# Patient Record
Sex: Female | Born: 1935 | Race: White | Hispanic: No | Smoking: Never smoker
Health system: Southern US, Community
[De-identification: ages and names within clinical notes are randomized; demographics above are authoritative.]

## PROBLEM LIST (undated history)

## (undated) DIAGNOSIS — C801 Malignant (primary) neoplasm, unspecified: Secondary | ICD-10-CM

## (undated) DIAGNOSIS — I10 Essential (primary) hypertension: Secondary | ICD-10-CM

## (undated) DIAGNOSIS — L0390 Cellulitis, unspecified: Secondary | ICD-10-CM

## (undated) HISTORY — PX: BREAST EXCISIONAL BIOPSY: SUR124

## (undated) HISTORY — PX: BREAST CYST ASPIRATION: SHX578

---

## 2004-06-24 ENCOUNTER — Ambulatory Visit: Payer: Self-pay | Admitting: Internal Medicine

## 2005-02-19 ENCOUNTER — Ambulatory Visit: Payer: Self-pay | Admitting: Gastroenterology

## 2005-03-11 ENCOUNTER — Ambulatory Visit: Payer: Self-pay | Admitting: Internal Medicine

## 2005-09-18 ENCOUNTER — Ambulatory Visit: Payer: Self-pay | Admitting: Internal Medicine

## 2006-02-03 ENCOUNTER — Ambulatory Visit: Payer: Self-pay | Admitting: Gerontology

## 2006-02-08 ENCOUNTER — Ambulatory Visit: Payer: Self-pay

## 2006-11-03 ENCOUNTER — Ambulatory Visit: Payer: Self-pay | Admitting: Internal Medicine

## 2007-04-26 ENCOUNTER — Ambulatory Visit: Payer: Self-pay | Admitting: Internal Medicine

## 2007-11-07 ENCOUNTER — Ambulatory Visit: Payer: Self-pay | Admitting: Internal Medicine

## 2009-03-05 ENCOUNTER — Ambulatory Visit: Payer: Self-pay | Admitting: Internal Medicine

## 2010-06-05 ENCOUNTER — Ambulatory Visit: Payer: Self-pay | Admitting: Podiatry

## 2010-09-23 ENCOUNTER — Ambulatory Visit: Payer: Self-pay | Admitting: Internal Medicine

## 2011-06-13 ENCOUNTER — Emergency Department: Payer: Self-pay | Admitting: Emergency Medicine

## 2011-06-13 LAB — URINALYSIS, COMPLETE
Bacteria: NONE SEEN
Bilirubin,UR: NEGATIVE
Blood: NEGATIVE
Glucose,UR: NEGATIVE mg/dL (ref 0–75)
Leukocyte Esterase: NEGATIVE
Nitrite: NEGATIVE
Ph: 7 (ref 4.5–8.0)
Protein: NEGATIVE
RBC,UR: 2 /HPF (ref 0–5)
Specific Gravity: 1.011 (ref 1.003–1.030)
Squamous Epithelial: <1
WBC UR: <1 /HPF (ref 0–5)

## 2011-06-13 LAB — CBC
HCT: 37.7 % (ref 35.0–47.0)
HGB: 12.7 g/dL (ref 12.0–16.0)
MCH: 30.4 pg (ref 26.0–34.0)
MCHC: 33.6 g/dL (ref 32.0–36.0)
MCV: 91 fL (ref 80–100)
Platelet: 208 10*3/uL (ref 150–440)
RBC: 4.17 10*6/uL (ref 3.80–5.20)
RDW: 13.6 % (ref 11.5–14.5)
WBC: 14 10*3/uL — ABNORMAL HIGH (ref 3.6–11.0)

## 2011-06-13 LAB — BASIC METABOLIC PANEL
Anion Gap: 11 (ref 7–16)
BUN: 17 mg/dL (ref 7–18)
Calcium, Total: 8.5 mg/dL (ref 8.5–10.1)
Chloride: 99 mmol/L (ref 98–107)
Co2: 27 mmol/L (ref 21–32)
Creatinine: 0.58 mg/dL — ABNORMAL LOW (ref 0.60–1.30)
EGFR (African American): >60
EGFR (Non-African Amer.): >60
Glucose: 114 mg/dL — ABNORMAL HIGH (ref 65–99)
Osmolality: 276 (ref 275–301)
Potassium: 3.4 mmol/L — ABNORMAL LOW (ref 3.5–5.1)
Sodium: 137 mmol/L (ref 136–145)

## 2011-10-21 ENCOUNTER — Ambulatory Visit: Payer: Self-pay | Admitting: Internal Medicine

## 2012-04-01 ENCOUNTER — Ambulatory Visit: Payer: Self-pay | Admitting: Internal Medicine

## 2012-05-27 ENCOUNTER — Ambulatory Visit: Payer: Self-pay | Admitting: Gastroenterology

## 2012-06-06 ENCOUNTER — Ambulatory Visit: Payer: Self-pay | Admitting: Gastroenterology

## 2012-06-08 ENCOUNTER — Ambulatory Visit: Payer: Self-pay | Admitting: Gastroenterology

## 2012-06-09 LAB — PATHOLOGY REPORT

## 2012-11-02 ENCOUNTER — Ambulatory Visit: Payer: Self-pay | Admitting: Internal Medicine

## 2012-11-29 ENCOUNTER — Ambulatory Visit: Payer: Self-pay | Admitting: Gastroenterology

## 2013-06-02 ENCOUNTER — Ambulatory Visit: Payer: Self-pay | Admitting: Gastroenterology

## 2014-01-22 ENCOUNTER — Ambulatory Visit: Payer: Self-pay | Admitting: Internal Medicine

## 2016-11-03 ENCOUNTER — Other Ambulatory Visit: Payer: Self-pay | Admitting: Internal Medicine

## 2016-11-03 DIAGNOSIS — Z1231 Encounter for screening mammogram for malignant neoplasm of breast: Secondary | ICD-10-CM

## 2016-11-18 ENCOUNTER — Ambulatory Visit
Admission: RE | Admit: 2016-11-18 | Discharge: 2016-11-18 | Disposition: A | Discharge status: Home / Self Care | Payer: Medicare Other | Source: Ambulatory Visit | Attending: Internal Medicine | Admitting: Internal Medicine

## 2016-11-18 DIAGNOSIS — Z1231 Encounter for screening mammogram for malignant neoplasm of breast: Secondary | ICD-10-CM | POA: Insufficient documentation

## 2016-11-23 ENCOUNTER — Other Ambulatory Visit: Payer: Self-pay | Admitting: Internal Medicine

## 2016-11-23 DIAGNOSIS — N6320 Unspecified lump in the left breast, unspecified quadrant: Secondary | ICD-10-CM

## 2016-11-23 DIAGNOSIS — R928 Other abnormal and inconclusive findings on diagnostic imaging of breast: Secondary | ICD-10-CM

## 2016-11-30 ENCOUNTER — Ambulatory Visit
Admission: RE | Admit: 2016-11-30 | Discharge: 2016-11-30 | Disposition: A | Discharge status: Home / Self Care | Payer: Medicare Other | Source: Ambulatory Visit | Attending: Internal Medicine | Admitting: Internal Medicine

## 2016-11-30 DIAGNOSIS — R928 Other abnormal and inconclusive findings on diagnostic imaging of breast: Secondary | ICD-10-CM

## 2016-11-30 DIAGNOSIS — N6320 Unspecified lump in the left breast, unspecified quadrant: Secondary | ICD-10-CM

## 2016-11-30 DIAGNOSIS — N6002 Solitary cyst of left breast: Secondary | ICD-10-CM | POA: Insufficient documentation

## 2016-12-04 ENCOUNTER — Encounter: Payer: Self-pay | Admitting: Emergency Medicine

## 2016-12-04 ENCOUNTER — Emergency Department: Payer: Medicare Other

## 2016-12-04 ENCOUNTER — Emergency Department
Admission: EM | Admit: 2016-12-04 | Discharge: 2016-12-04 | Disposition: A | Discharge status: Home / Self Care | Payer: Medicare Other | Attending: Emergency Medicine | Admitting: Emergency Medicine

## 2016-12-04 DIAGNOSIS — Z7982 Long term (current) use of aspirin: Secondary | ICD-10-CM | POA: Insufficient documentation

## 2016-12-04 DIAGNOSIS — I10 Essential (primary) hypertension: Secondary | ICD-10-CM | POA: Diagnosis not present

## 2016-12-04 DIAGNOSIS — Z79899 Other long term (current) drug therapy: Secondary | ICD-10-CM | POA: Insufficient documentation

## 2016-12-04 DIAGNOSIS — W010XXA Fall on same level from slipping, tripping and stumbling without subsequent striking against object, initial encounter: Secondary | ICD-10-CM | POA: Insufficient documentation

## 2016-12-04 DIAGNOSIS — W19XXXA Unspecified fall, initial encounter: Secondary | ICD-10-CM

## 2016-12-04 DIAGNOSIS — S0181XA Laceration without foreign body of other part of head, initial encounter: Secondary | ICD-10-CM | POA: Diagnosis not present

## 2016-12-04 DIAGNOSIS — Y92014 Private driveway to single-family (private) house as the place of occurrence of the external cause: Secondary | ICD-10-CM | POA: Insufficient documentation

## 2016-12-04 DIAGNOSIS — S022XXA Fracture of nasal bones, initial encounter for closed fracture: Secondary | ICD-10-CM | POA: Insufficient documentation

## 2016-12-04 DIAGNOSIS — Y998 Other external cause status: Secondary | ICD-10-CM | POA: Insufficient documentation

## 2016-12-04 DIAGNOSIS — Y9301 Activity, walking, marching and hiking: Secondary | ICD-10-CM | POA: Diagnosis not present

## 2016-12-04 DIAGNOSIS — S0993XA Unspecified injury of face, initial encounter: Secondary | ICD-10-CM | POA: Diagnosis present

## 2016-12-04 HISTORY — DX: Malignant (primary) neoplasm, unspecified: C80.1

## 2016-12-04 HISTORY — DX: Cellulitis, unspecified: L03.90

## 2016-12-04 HISTORY — DX: Essential (primary) hypertension: I10

## 2016-12-04 MED ORDER — ACETAMINOPHEN 325 MG PO TABS
650.0000 mg | ORAL_TABLET | Freq: Once | ORAL | Status: AC
  Administered 2016-12-04: 650 mg via ORAL
  Filled 2016-12-04: qty 2

## 2016-12-04 MED ORDER — LIDOCAINE HCL (PF) 1 % IJ SOLN
5.0000 mL | Freq: Once | INTRAMUSCULAR | Status: AC
  Administered 2016-12-04: 5 mL
  Filled 2016-12-04: qty 5

## 2016-12-04 MED ORDER — LIDOCAINE-EPINEPHRINE-TETRACAINE (LET) SOLUTION
3.0000 mL | Freq: Once | NASAL | Status: AC
  Administered 2016-12-04: 3 mL via TOPICAL
  Filled 2016-12-04: qty 3

## 2016-12-04 NOTE — ED Notes (Signed)
See triage note  States she tripped   Hit her forehead  Laceration noted to left eye and side of nose  No loc

## 2016-12-04 NOTE — Discharge Instructions (Addendum)
Follow-up with Dr. Richardson Landry as scheduled. Keep the wounds clean and covered. Use antibiotic ointment to the areas. Take Tylenol and ibuprofen as needed.

## 2016-12-04 NOTE — ED Triage Notes (Signed)
Fall.  Patient states she stumbled and hit forehead on railroad tie.  Denies LOC  Lac to left eyebrow and left side of nose.  Bleeding controlled.

## 2016-12-04 NOTE — ED Provider Notes (Signed)
[facility] Emergency Department Provider Note ____________________________________________  Time seen: 1459  I have reviewed the triage vital signs and the nursing notes.  HISTORY  Chief Complaint  Fall and Laceration  HPI Susan Escobar is a 83 y.o. female presents to the ED, accompanied by her adult daughter, for evaluation of injury sustained following a mechanical fall at home. The patient does work at the [employer], when the patient stumbled as she was walking up the heel towards the house. She fell, hitting her face and forehead on [place]. Patient denies any loss of consciousness, nausea, vomiting, or dizziness. Witnessed fall by her daughter, reports that she was able to stand and walk with only standby assistance. The patient did sustain a laceration to the left eyebrow and to the left side of the nose. She presents now with bleeding controlled both areas. She notes some heaviness to the upper left lid as well as some mild tenderness to the nose. She denies any nosebleed, congestion, or dental injury. Patient denies any other injuries at this time. She is currently being treated by [facility] for a skin infection to the left foot. The fall was due to her wearing some large athletic-style flip-flops. Patient denies any ankle or foot injury.  Past Medical History:  Diagnosis Date  . Cancer ([place])    skin cancer  . Cellulitis    left foot  . Hypertension     There are no active problems to display for this patient.   Past Surgical History:  Procedure Laterality Date  . BREAST CYST ASPIRATION Right   . BREAST EXCISIONAL BIOPSY Left    1980's    Prior to Admission medications   Medication Sig Start Date End Date Taking? Authorizing Provider  aspirin 81 MG chewable tablet Chew 81 mg by mouth daily.   Yes [provider]  furosemide (LASIX) 20 MG tablet Take 20 mg by mouth.   Yes [provider]  levothyroxine  (SYNTHROID, LEVOTHROID) 50 MCG tablet Take 50 mcg by mouth daily before breakfast.   Yes [provider]  lisinopril-hydrochlorothiazide (PRINZIDE,ZESTORETIC) 20-12.5 MG tablet Take 1 tablet by mouth daily.   Yes [provider]    Allergies Patient has no known allergies.  No family history on file.  Social History Social History  Substance Use Topics  . Smoking status: Never Smoker  . Smokeless tobacco: Never Used  . Alcohol use Not on file    Review of Systems  Constitutional: Negative for fever. Eyes: Negative for visual changes. Left eyebrow lac as above ENT: Negative for sore throat. Nasal laceration as above.  Cardiovascular: Negative for chest pain. Respiratory: Negative for shortness of breath. Gastrointestinal: Negative for abdominal pain, vomiting and diarrhea. Musculoskeletal: Negative for back pain. Skin: Negative for rash. Neurological: Negative for headaches, focal weakness or numbness. ____________________________________________  PHYSICAL EXAM:  VITAL SIGNS: ED Triage Vitals  Enc Vitals Group     BP 12/04/16 1355 (!) 142/51     Pulse Rate 12/04/16 1355 71     Resp 12/04/16 1355 16     Temp 12/04/16 1355 97.8 F (36.6 C)     Temp Source 12/04/16 1355 Oral     SpO2 12/04/16 1355 98 %     Weight 12/04/16 1351 152 lb (68.9 kg)     Height 12/04/16 1351 5' (1.524 m)     Head Circumference --      Peak Flow --  Pain Score 12/04/16 1351 5     Pain Loc --      Pain Edu? --      Excl. in [place]? --     Constitutional: Alert and oriented. Well appearing and in no distress. Head: Normocephalic and atraumatic, except for a linear laceration over the left brow. Left upper lid edema and ecchymosis noted. Eyes: Conjunctivae are normal. PERRL. Normal extraocular movements Ears: Canals clear. TMs intact bilaterally. Nose: No congestion/rhinorrhea/epistaxis. No nasal septal deviation noted. Subtle STS over the nasal bridge appreciated. No septal  hematoma noted. Laceration to the left side of the nasal bridge.  Mouth/Throat: Mucous membranes are moist. Neck: Supple. No thyromegaly. Cardiovascular: Normal rate, regular rhythm. Normal distal pulses. Respiratory: Normal respiratory effort. No wheezes/rales/rhonchi. Musculoskeletal: Nontender with normal range of motion in all extremities.  Neurologic: CN II-XII grossly intact. Norma gross sensation. Normal speech and language. No gross focal neurologic deficits are appreciated. Skin:  Skin is warm, dry and intact. No rash noted. Psychiatric: Mood and affect are normal. Patient exhibits appropriate insight and judgment. ____________________________________________   RADIOLOGY  CT Head & Maxillofacial w/o CM  IMPRESSION: CT of the head:  No acute intracranial abnormality noted.  CT of the maxillofacial bones: Bilateral nasal bone fractures and mild soft tissue changes consistent with the recent injury. No sizable hematoma is seen.  ____________________________________________  PROCEDURES  Tylenol 650 mg PO  LACERATION REPAIR Performed by: Melvenia Needles Authorized by: Melvenia Needles Consent: Verbal consent obtained. Risks and benefits: risks, benefits and alternatives were discussed Consent given by: patient Patient identity confirmed: provided demographic data Prepped and Draped in normal sterile fashion Wound explored  Laceration Location: left brow & nasal bridge  Laceration Length: 3 cm & 1.2 cm, respectively  No Foreign Bodies seen or palpated  Anesthesia: local infiltration  Local anesthetic: lidocaine 1% w/o epinephrine  Anesthetic total: 3 ml  Irrigation method: saline/betadine Amount of cleaning: standard sterile prep & drape  Skin closure: 5-0 nylon  Number of sutures: brow = 6 & nose = 2  Technique: interrupted  Patient tolerance: Patient tolerated the procedure well with no immediate  complications. ____________________________________________  INITIAL IMPRESSION / ASSESSMENT AND PLAN / ED COURSE  Geriatric patient with a mechanical fall resulting in a brow & nasal lac with bilateral nasal bone fractures. She has her wounds repaired with sutures, and is referred to Dr. Richardson Landry for suture removal and further fracture management. She is discharged with instructions to dose Tylenol or ibuprofen and return to the ED as needed.  ____________________________________________  FINAL CLINICAL IMPRESSION(S) / ED DIAGNOSES  Final diagnoses:  Facial laceration, initial encounter  Closed fracture of nasal bone, initial encounter      Melvenia Needles, PA-C 12/04/16 1717    Carrie Mew, MD 12/04/16 2018

## 2016-12-10 ENCOUNTER — Ambulatory Visit: Payer: Medicare Other

## 2016-12-10 ENCOUNTER — Other Ambulatory Visit: Payer: Medicare Other

## 2017-02-22 ENCOUNTER — Ambulatory Visit (INDEPENDENT_AMBULATORY_CARE_PROVIDER_SITE_OTHER): Payer: Medicare Other | Admitting: Vascular Surgery

## 2017-02-22 ENCOUNTER — Encounter (INDEPENDENT_AMBULATORY_CARE_PROVIDER_SITE_OTHER): Payer: Self-pay | Admitting: Vascular Surgery

## 2017-02-22 VITALS — BP 119/63 | HR 59 | Resp 16 | Ht 61.0 in | Wt 156.0 lb

## 2017-02-22 DIAGNOSIS — M79604 Pain in right leg: Secondary | ICD-10-CM | POA: Diagnosis not present

## 2017-02-22 DIAGNOSIS — M79605 Pain in left leg: Secondary | ICD-10-CM | POA: Diagnosis not present

## 2017-02-22 DIAGNOSIS — I6523 Occlusion and stenosis of bilateral carotid arteries: Secondary | ICD-10-CM

## 2017-02-22 DIAGNOSIS — R600 Localized edema: Secondary | ICD-10-CM | POA: Diagnosis not present

## 2017-02-22 NOTE — Progress Notes (Signed)
Subjective:    Patient ID: Susan Escobar, female    DOB: 05/02/1931, 83 y.o.   MRN: [id number] Chief Complaint  Patient presents with  . New Patient (Initial Visit)    ref Fath peripheral edema   Presents as a new patient referred by Dr. Ubaldo Glassing for evaluation of lower extremity edema. Patient states she was seen in our office in the past for carotid stenosis and lower extremity edema. Bilateral carotid duplex in 2006 notable for no hemodynamically significant stenosis is identified. Bilateral DVT study in 2007 negative. Patient states she has been experiencing bilateral lower extremity edema for the last 5-6 years. Patient states she wears compression stockings on an intermittent basis. She will wear her compression stockings if her legs are "very swollen" or if she will be standing for long periods of times. Patient states she does take Lasix. Patient experiences a bilateral shin pain. This is intermittent and usually at night. She does state this will wake her up in the middle of the night. The pain will subside if she starts to walk. She denies any claudication-like symptoms, rest pain or ulcerations to the lower extremity. Denies any surgery or trauma to the bilateral lower extremity. Denies any history of DVT. Denies any fever, nausea or vomiting.   Review of Systems  Constitutional: Negative.   HENT: Negative.   Eyes: Negative.   Respiratory: Negative.   Cardiovascular: Positive for leg swelling.       Lower extremity pain  Gastrointestinal: Negative.   Endocrine: Negative.   Genitourinary: Negative.   Musculoskeletal: Negative.   Skin: Negative.   Allergic/Immunologic: Negative.   Neurological: Negative.   Hematological: Negative.   Psychiatric/Behavioral: Negative.       Objective:   Physical Exam  Constitutional: She is oriented to person, place, and time. She appears well-developed and well-nourished. No distress.  HENT:  Head: Normocephalic and atraumatic.  Eyes: Pupils  are equal, round, and reactive to light. Conjunctivae are normal.  Neck: Normal range of motion.  No carotid bruits noted on exam  Cardiovascular: Normal rate, regular rhythm, normal heart sounds and intact distal pulses.   Pulses:      Radial pulses are 2+ on the right side, and 2+ on the left side.  Hard to palpate pedal pulses due to pitting edema however her bilateral feet are relatively warm  Pulmonary/Chest: Effort normal.  Musculoskeletal: Normal range of motion. She exhibits edema (Bilateral mild pitting edema noted.).  Neurological: She is alert and oriented to person, place, and time.  Skin: Skin is warm and dry. She is not diaphoretic.  Skin is intact. Scattered less than 1 cm varicose veins noted bilaterally. No stasis dermatitis. No cellulitis.  Psychiatric: She has a normal mood and affect. Her behavior is normal. Judgment and thought content normal.  Vitals reviewed.  BP 119/63 (BP Location: Left Arm)   Pulse (!) 59   Resp 16   Ht 5\' 1"  (1.549 m)   Wt 156 lb (70.8 kg)   BMI 29.48 kg/m   Past Medical History:  Diagnosis Date  . Cancer ([place])    skin cancer  . Cellulitis    left foot  . Hypertension    Social History   Social History  . Marital status: Married    Spouse name: N/A  . Number of children: N/A  . Years of education: N/A   Occupational History  . Not on file.   Social History Main Topics  . Smoking status: Never Smoker  .  Smokeless tobacco: Never Used  . Alcohol use Yes     Comment: ocassionally  . Drug use: No  . Sexual activity: Not on file   Other Topics Concern  . Not on file   Social History Narrative  . No narrative on file   Past Surgical History:  Procedure Laterality Date  . BREAST CYST ASPIRATION Right   . BREAST EXCISIONAL BIOPSY Left    1980's   Family History  Problem Relation Age of Onset  . Diabetes Mother    No Known Allergies     Assessment & Plan:  Presents as a new patient referred by Dr. Ubaldo Glassing for  evaluation of lower extremity edema. Patient states she was seen in our office in the past for carotid stenosis and lower extremity edema. Bilateral carotid duplex in 2006 notable for no hemodynamically significant stenosis is identified. Bilateral DVT study in 2007 negative. Patient states she has been experiencing bilateral lower extremity edema for the last 5-6 years. Patient states she wears compression stockings on an intermittent basis. She will wear her compression stockings if her legs are "very swollen" or if she will be standing for long periods of times. Patient states she does take Lasix. Patient experiences a bilateral shin pain. This is intermittent and usually at night. She does state this will wake her up in the middle of the night. The pain will subside if she starts to walk. She denies any claudication-like symptoms, rest pain or ulcerations to the lower extremity. Denies any surgery or trauma to the bilateral lower extremity. Denies any history of DVT. Denies any fever, nausea or vomiting.  1. Bilateral carotid artery stenosis - Stable Last ultrasound in 2006 with no hemodynamically stenosis. Due to patient risk factors and age feel it is reasonable to repeat a carotid duplex to assess for any carotid stenosis  - VAS US CAROTID; Future  2. Bilateral lower extremity edema - New The patient was encouraged to wear graduated compression stockings (20-30 mmHg) on a daily basis. The patient was instructed to begin wearing the stockings first thing in the morning and removing them in the evening. The patient was instructed specifically not to sleep in the stockings. Prescription given. In addition, behavioral modification including elevation during the day will be initiated. Anti-inflammatories for pain. The patient will follow up in one month to asses conservative management.  Information on chronic venous insufficiency and compression stockings was given to the patient. The patient was  instructed to call the office in the interim if any worsening edema or ulcerations to the legs, feet or toes occurs. The patient expresses their understanding.  - VAS [place] LOWER EXTREMITY VENOUS REFLUX; Future  3. Pain in both lower extremities - New Patient with cramping at night which will wake her up from sleep Hard to palpate pedal pulses on exam Low threshold for full artery disease however due to her age and risk factors like to order an ABI to rule out any peripheral artery disease that may be contributing to her discomfort  - VAS [place] ABI WITH/WO TBI; Future  Current Outpatient Prescriptions on File Prior to Visit  Medication Sig Dispense Refill  . aspirin 81 MG chewable tablet Chew 81 mg by mouth daily.    . furosemide (LASIX) 20 MG tablet Take 20 mg by mouth.    . levothyroxine (SYNTHROID, LEVOTHROID) 50 MCG tablet Take 50 mcg by mouth daily before breakfast.    . lisinopril-hydrochlorothiazide (PRINZIDE,ZESTORETIC) 20-12.5 MG tablet Take 1 tablet by  mouth daily.     No current facility-administered medications on file prior to visit.     There are no Patient Instructions on file for this visit. No Follow-up on file.   Cleone Hulick A Wilena Tyndall, PA-C

## 2017-04-12 ENCOUNTER — Encounter (INDEPENDENT_AMBULATORY_CARE_PROVIDER_SITE_OTHER): Payer: Self-pay

## 2017-04-12 ENCOUNTER — Ambulatory Visit (INDEPENDENT_AMBULATORY_CARE_PROVIDER_SITE_OTHER): Payer: Medicare Other

## 2017-04-12 ENCOUNTER — Encounter (INDEPENDENT_AMBULATORY_CARE_PROVIDER_SITE_OTHER): Payer: Self-pay | Admitting: Vascular Surgery

## 2017-04-12 ENCOUNTER — Ambulatory Visit (INDEPENDENT_AMBULATORY_CARE_PROVIDER_SITE_OTHER): Payer: Medicare Other | Admitting: Vascular Surgery

## 2017-04-12 DIAGNOSIS — I872 Venous insufficiency (chronic) (peripheral): Secondary | ICD-10-CM | POA: Diagnosis not present

## 2017-04-12 DIAGNOSIS — I6523 Occlusion and stenosis of bilateral carotid arteries: Secondary | ICD-10-CM

## 2017-04-12 DIAGNOSIS — E039 Hypothyroidism, unspecified: Secondary | ICD-10-CM

## 2017-04-12 DIAGNOSIS — R600 Localized edema: Secondary | ICD-10-CM

## 2017-04-12 DIAGNOSIS — I70213 Atherosclerosis of native arteries of extremities with intermittent claudication, bilateral legs: Secondary | ICD-10-CM | POA: Diagnosis not present

## 2017-04-12 DIAGNOSIS — M79605 Pain in left leg: Secondary | ICD-10-CM

## 2017-04-12 DIAGNOSIS — M79604 Pain in right leg: Secondary | ICD-10-CM | POA: Diagnosis not present

## 2017-04-12 DIAGNOSIS — I10 Essential (primary) hypertension: Secondary | ICD-10-CM

## 2017-04-15 ENCOUNTER — Encounter (INDEPENDENT_AMBULATORY_CARE_PROVIDER_SITE_OTHER): Payer: Self-pay | Admitting: Vascular Surgery

## 2017-04-15 DIAGNOSIS — E039 Hypothyroidism, unspecified: Secondary | ICD-10-CM | POA: Insufficient documentation

## 2017-04-15 DIAGNOSIS — I6529 Occlusion and stenosis of unspecified carotid artery: Secondary | ICD-10-CM | POA: Insufficient documentation

## 2017-04-15 DIAGNOSIS — I70219 Atherosclerosis of native arteries of extremities with intermittent claudication, unspecified extremity: Secondary | ICD-10-CM | POA: Insufficient documentation

## 2017-04-15 DIAGNOSIS — I739 Peripheral vascular disease, unspecified: Secondary | ICD-10-CM | POA: Insufficient documentation

## 2017-04-15 DIAGNOSIS — I10 Essential (primary) hypertension: Secondary | ICD-10-CM | POA: Insufficient documentation

## 2017-04-15 NOTE — Progress Notes (Signed)
MRN : [id number]  LOGYN DEDOMINICIS is a 83 y.o. (December 10, 1930) female who presents with chief complaint of  Chief Complaint  Patient presents with  . Carotid    1 month Carotid  .  History of Present Illness: The patient returns to the office for followup and review of the noninvasive studies. There have been no interval changes in lower extremity symptoms. No interval shortening of the patient's claudication distance or development of rest pain symptoms. No new ulcers or wounds have occurred since the last visit.  There have been no significant changes to the patient's overall health care.  The patient denies amaurosis fugax or recent TIA symptoms. There are no recent neurological changes noted. The patient denies history of DVT, PE or superficial thrombophlebitis. The patient denies recent episodes of angina or shortness of breath.   Carotid duplex shows <50% bilateral disease no change since 2016 ABI's are consistent with mild atherosclerosis Venous duplex shows deep venous reflux   Current Meds  Medication Sig  . acetaminophen (TYLENOL) 325 MG tablet Take by mouth.  Marland Kitchen aspirin 81 MG chewable tablet Chew 81 mg by mouth daily.  Marland Kitchen augmented betamethasone dipropionate (DIPROLENE-AF) 0.05 % ointment   . Cholecalciferol (VITAMIN D3) 2000 units capsule Take by mouth.  . Ergocalciferol 2000 units CAPS Take by mouth.  . furosemide (LASIX) 20 MG tablet Take 20 mg by mouth.  . levothyroxine (SYNTHROID, LEVOTHROID) 50 MCG tablet Take 50 mcg by mouth daily before breakfast.  . lisinopril (PRINIVIL,ZESTRIL) 2.5 MG tablet Take by mouth.  Marland Kitchen lisinopril-hydrochlorothiazide (PRINZIDE,ZESTORETIC) 20-12.5 MG tablet Take 1 tablet by mouth daily.  . mupirocin ointment (BACTROBAN) 2 %   . vitamin B-12 (CYANOCOBALAMIN) 500 MCG tablet Take by mouth.    Past Medical History:  Diagnosis Date  . Cancer ([place])    skin cancer  . Cellulitis    left foot  . Hypertension     Past Surgical History:    Procedure Laterality Date  . BREAST CYST ASPIRATION Right   . BREAST EXCISIONAL BIOPSY Left    1980's    Social History Social History   Tobacco Use  . Smoking status: Never Smoker  . Smokeless tobacco: Never Used  Substance Use Topics  . Alcohol use: Yes    Comment: ocassionally  . Drug use: No    Family History Family History  Problem Relation Age of Onset  . Diabetes Mother     Allergies  Allergen Reactions  . Alendronate     Other reaction(s): Muscle Pain  . Statins     Other reaction(s): Other (See Comments) Frequent leg cramps and pain     REVIEW OF SYSTEMS (Negative unless checked)  Constitutional: [] Weight loss  [] Fever  [] Chills Cardiac: [] Chest pain   [] Chest pressure   [] Palpitations   [] Shortness of breath when laying flat   [] Shortness of breath with exertion. Vascular:  [] Pain in legs with walking   [] Pain in legs at rest  [] History of DVT   [] Phlebitis   [] Swelling in legs   [] Varicose veins   [] Non-healing ulcers Pulmonary:   [] Uses home oxygen   [] Productive cough   [] Hemoptysis   [] Wheeze  [] COPD   [] Asthma Neurologic:  [] Dizziness   [] Seizures   [] History of stroke   [] History of TIA  [] Aphasia   [] Vissual changes   [] Weakness or numbness in arm   [] Weakness or numbness in leg Musculoskeletal:   [] Joint swelling   [] Joint pain   [] Low back pain  Hematologic:  [] Easy bruising  [] Easy bleeding   [] Hypercoagulable state   [] Anemic Gastrointestinal:  [] Diarrhea   [] Vomiting  [] Gastroesophageal reflux/heartburn   [] Difficulty swallowing. Genitourinary:  [] Chronic kidney disease   [] Difficult urination  [] Frequent urination   [] Blood in urine Skin:  [] Rashes   [] Ulcers  Psychological:  [] History of anxiety   []  History of major depression.  Physical Examination  Vitals:   04/12/17 1504 04/12/17 1505  BP: (!) 177/75 (!) 141/74  Pulse: (!) 56 (!) 55  Resp: 16   Weight: 155 lb (70.3 kg)   Height: 5\' 1"  (1.549 m)    Body mass index is 29.29  kg/m. Gen: WD/WN, NAD Head: [place]/AT, No temporalis wasting.  Ear/Nose/Throat: Hearing grossly intact, nares w/o erythema or drainage Eyes: PER, EOMI, sclera nonicteric.  Neck: Supple, no large masses.   Pulmonary:  Good air movement, no audible wheezing bilaterally, no use of accessory muscles.  Cardiac: RRR, no JVD Vascular:  Soft bilateral carotid bruit Vessel Right Left  Radial Palpable Palpable  Ulnar Palpable Palpable  PT Trace Palpable Trace Palpable  DP Not Palpable Not Palpable  Gastrointestinal: Non-distended. No guarding/no peritoneal signs.  Musculoskeletal: M/S 5/5 throughout.  No deformity or atrophy.  Neurologic: CN 2-12 intact. Symmetrical.  Speech is fluent. Motor exam as listed above. Psychiatric: Judgment intact, Mood & affect appropriate for pt's clinical situation. Dermatologic: mild venous rashes no ulcers noted.  No changes consistent with cellulitis. Lymph : No lichenification or skin changes of chronic lymphedema.  CBC Lab Results  Component Value Date   WBC 14.0 (H) 06/13/2011   HGB 12.7 06/13/2011   HCT 37.7 06/13/2011   MCV 91 06/13/2011   PLT 208 06/13/2011    BMET    Component Value Date/Time   NA 137 06/13/2011 1644   K 3.4 (L) 06/13/2011 1644   CL 99 06/13/2011 1644   CO2 27 06/13/2011 1644   GLUCOSE 114 (H) 06/13/2011 1644   BUN 17 06/13/2011 1644   CREATININE 0.58 (L) 06/13/2011 1644   CALCIUM 8.5 06/13/2011 1644   GFRNONAA >60 06/13/2011 1644   GFRAA >60 06/13/2011 1644   CrCl cannot be calculated (Patient's most recent lab result is older than the maximum 21 days allowed.).  COAG No results found for: INR, PROTIME  Radiology No results found.  Assessment/Plan 1. Chronic venous insufficiency No surgery or intervention at this point in time.  I have reviewed my discussion with the patient regarding venous insufficiency and why it causes symptoms. I have discussed with the patient the chronic skin changes that accompany venous  insufficiency and the long term sequela such as ulceration. Patient will contnue wearing graduated compression stockings on a daily basis, as this has provided excellent control of his edema. The patient will put the stockings on first thing in the morning and removing them in the evening. The patient is reminded not to sleep in the stockings.  In addition, behavioral modification including elevation during the day will be initiated. Exercise is strongly encouraged.  Duplex ultrasound of the bilateral lower extremity shows deep venous reflux  Given the patient's good control and lack of any problems regarding the venous insufficiency and lymphedema a lymph pump in not need at this time.  The patient will follow up with me PRN should anything change.  The patient voices agreement with this plan.   2. Bilateral carotid artery stenosis Recommend:  Given the patient's asymptomatic subcritical stenosis no further invasive testing or surgery at this time.  Duplex ultrasound shows <50% stenosis bilaterally.  Continue antiplatelet therapy as prescribed Continue management of CAD, HTN and Hyperlipidemia Healthy heart diet,  encouraged exercise at least 4 times per week  3. Atherosclerosis of native artery of both lower extremities with intermittent claudication (HCC)  Recommend:  The patient has evidence of atherosclerosis of the lower extremities with claudication.  The patient does not voice lifestyle limiting changes at this point in time.  Noninvasive studies do not suggest clinically significant change.  No invasive studies, angiography or surgery at this time The patient should continue walking and begin a more formal exercise program.  The patient should continue antiplatelet therapy and aggressive treatment of the lipid abnormalities  No changes in the patient's medications at this time  The patient should continue wearing graduated compression socks 15-20 mmHg strength to control  the mild edema.    4. Essential hypertension Continue antihypertensive medications as already ordered, these medications have been reviewed and there are no changes at this time.   5. Hypothyroidism, unspecified type Continue Synthroid as already ordered, these medications have been reviewed and there are no changes at this time.   Hortencia Pilar, MD  04/15/2017 11:51 AM

## 2017-08-18 ENCOUNTER — Other Ambulatory Visit: Payer: Self-pay | Admitting: Family Medicine

## 2017-08-18 DIAGNOSIS — M5030 Other cervical disc degeneration, unspecified cervical region: Secondary | ICD-10-CM

## 2017-08-25 ENCOUNTER — Ambulatory Visit
Admission: RE | Admit: 2017-08-25 | Discharge: 2017-08-25 | Disposition: A | Discharge status: Home / Self Care | Payer: Medicare Other | Source: Ambulatory Visit | Attending: Family Medicine | Admitting: Family Medicine

## 2017-08-25 DIAGNOSIS — M50221 Other cervical disc displacement at C4-C5 level: Secondary | ICD-10-CM | POA: Diagnosis not present

## 2017-08-25 DIAGNOSIS — M4802 Spinal stenosis, cervical region: Secondary | ICD-10-CM | POA: Insufficient documentation

## 2017-08-25 DIAGNOSIS — M5030 Other cervical disc degeneration, unspecified cervical region: Secondary | ICD-10-CM | POA: Diagnosis not present

## 2017-08-25 DIAGNOSIS — M4302 Spondylolysis, cervical region: Secondary | ICD-10-CM | POA: Diagnosis present

## 2018-06-29 ENCOUNTER — Other Ambulatory Visit: Payer: Self-pay | Admitting: Internal Medicine

## 2018-06-29 DIAGNOSIS — Z1231 Encounter for screening mammogram for malignant neoplasm of breast: Secondary | ICD-10-CM

## 2018-07-19 ENCOUNTER — Ambulatory Visit
Admission: RE | Admit: 2018-07-19 | Discharge: 2018-07-19 | Disposition: A | Discharge status: Home / Self Care | Payer: Medicare Other | Source: Ambulatory Visit | Attending: Internal Medicine | Admitting: Internal Medicine

## 2018-07-19 DIAGNOSIS — Z1231 Encounter for screening mammogram for malignant neoplasm of breast: Secondary | ICD-10-CM | POA: Diagnosis not present

## 2019-04-28 IMAGING — CT CT MAXILLOFACIAL W/O CM
4 of 6 series · 16 of 47 positions shown, 18 images · non-contrast
Comparison: None.

CLINICAL DATA: Recent fall with facial contusions

EXAM:
CT HEAD WITHOUT CONTRAST
CT MAXILLOFACIAL WITHOUT CONTRAST
TECHNIQUE: Multidetector CT imaging of the head and maxillofacial structures
were performed using the standard protocol without intravenous
contrast. Multiplanar CT image reconstructions of the maxillofacial
structures were also generated.

[Series 3: max soft · axial · 0.32mm/px · z∈[-173,-95]mm · 5 of 82 slices shown]
[im 8/82  brain]
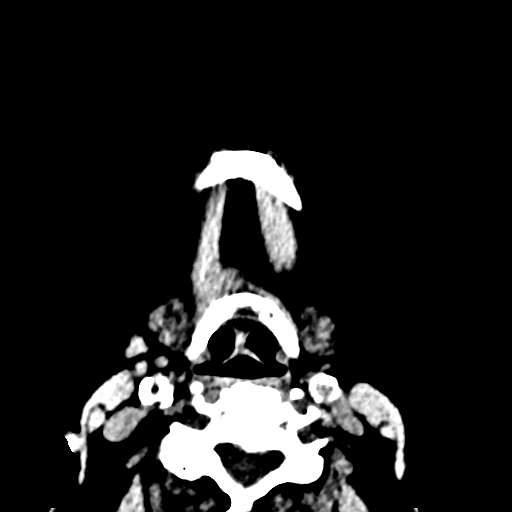
[im 16/82  brain]
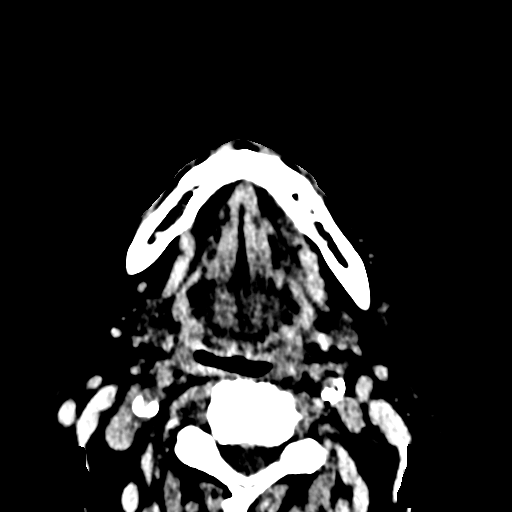
[im 28/82  brain]
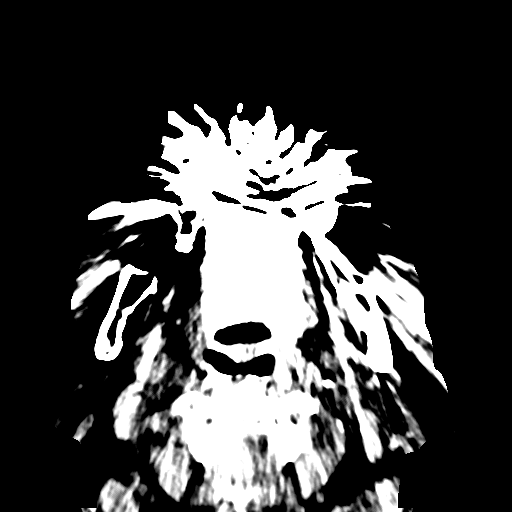
[im 35/82  brain]
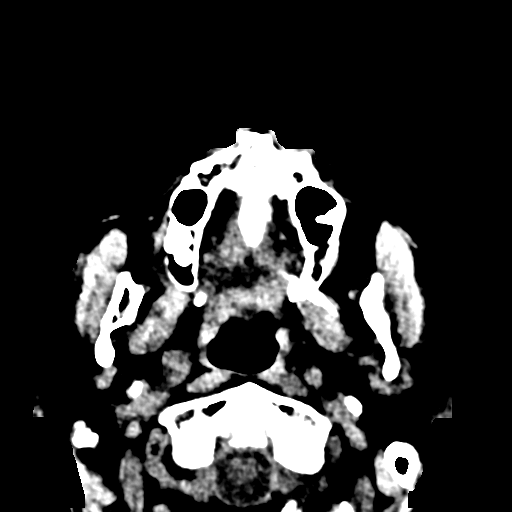
[im 47/82  brain]
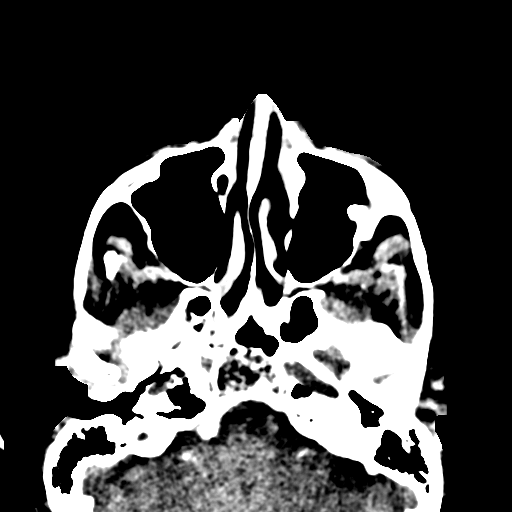

[Series 7: coronal soft · coronal · 0.31mm/px · 3 of 79 slices shown]
[im 16/79  bone]
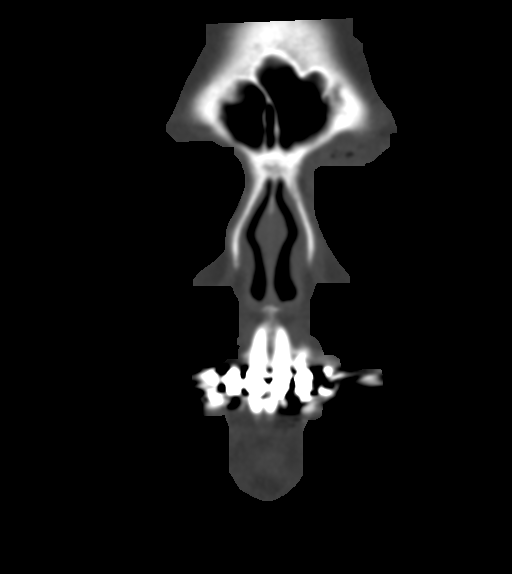
[im 32/79  bone]
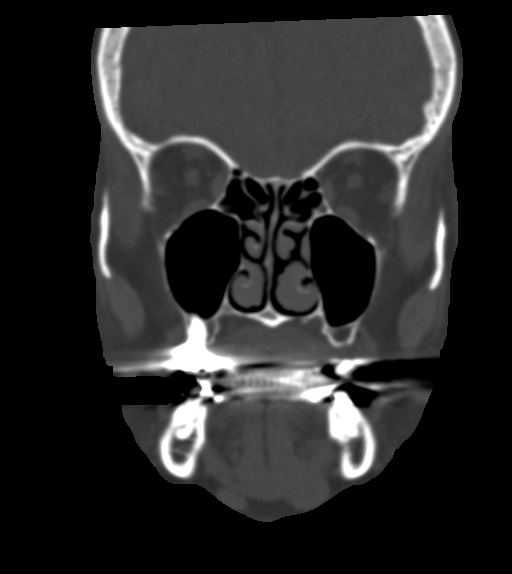
[im 47/79  bone]
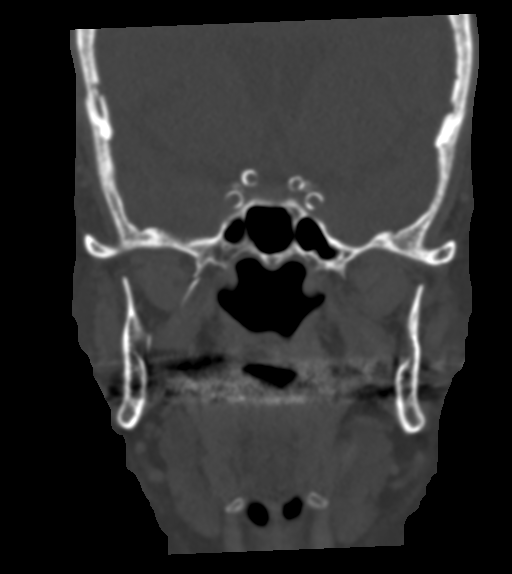

[Series 8: sagittal soft · sagittal · 0.34mm/px · 2 of 71 slices shown]
[im 24/71  bone]
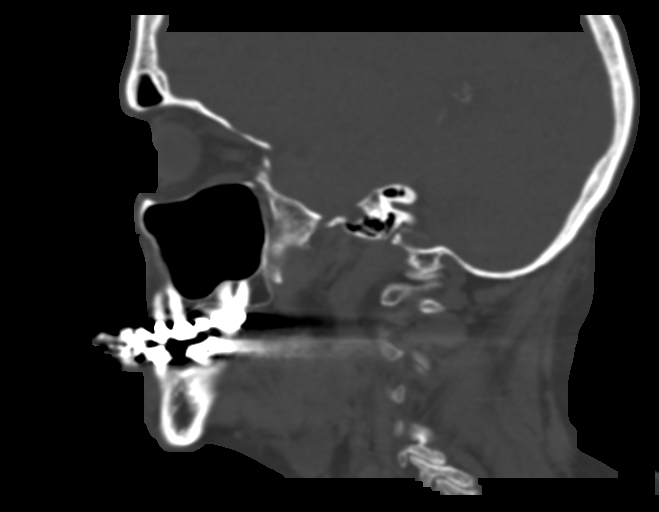
[im 47/71  bone]
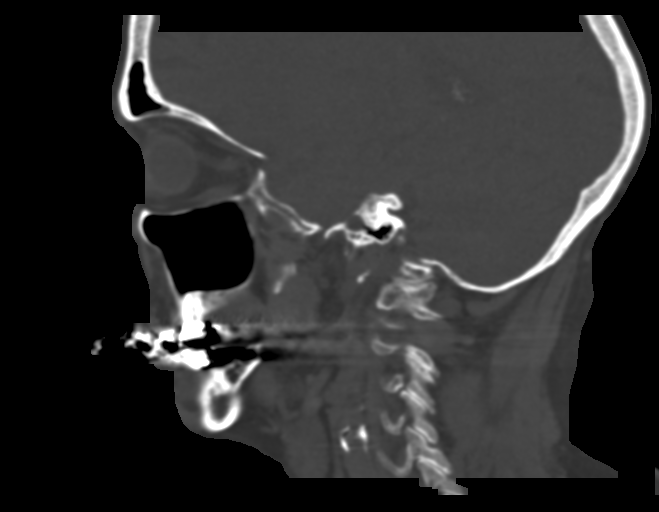

[Series 11: head wo · axial · 0.44mm/px · z∈[-60,+45]mm · 6 of 31 slices shown, 8 images]
[im 5/31  brain]
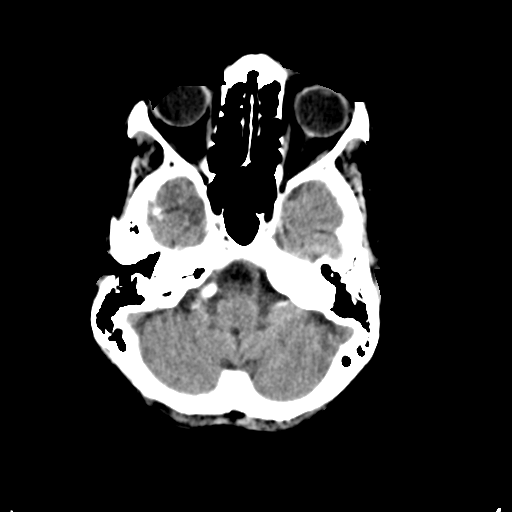
[im 5/31  bone]
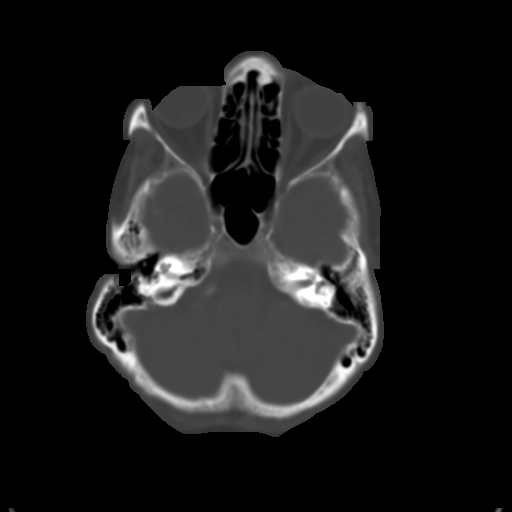
[im 9/31  bone]
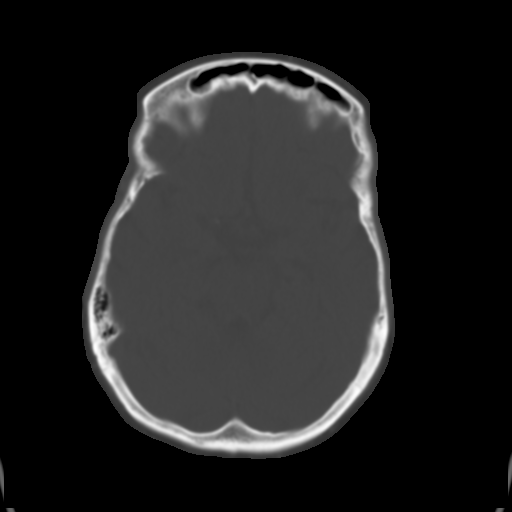
[im 13/31  bone]
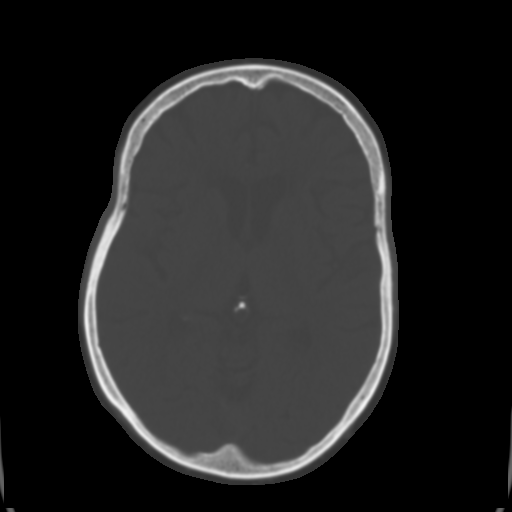
[im 18/31  bone]
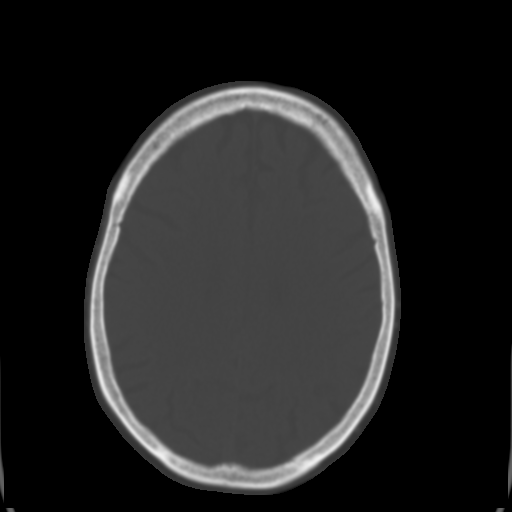
[im 22/31  brain]
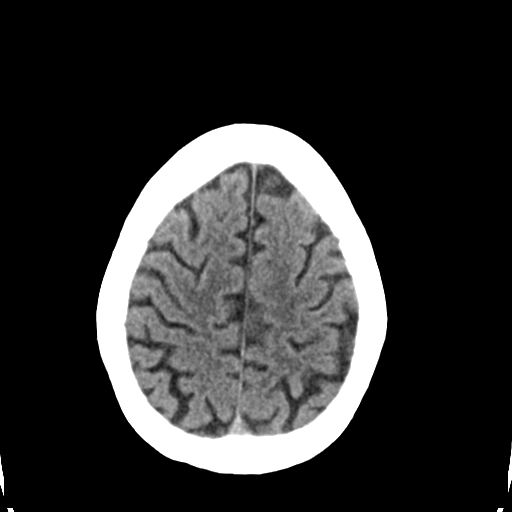
[im 22/31  bone]
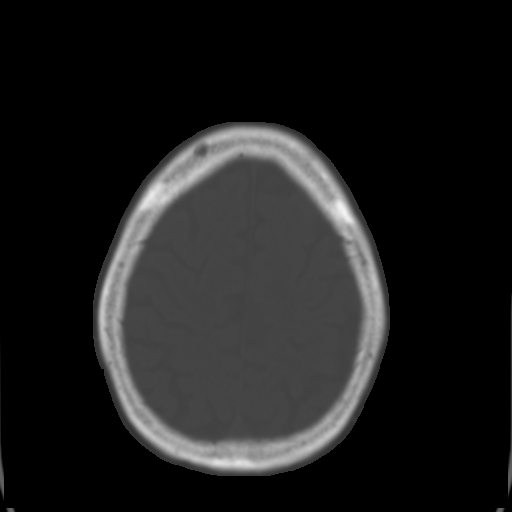
[im 26/31  bone]
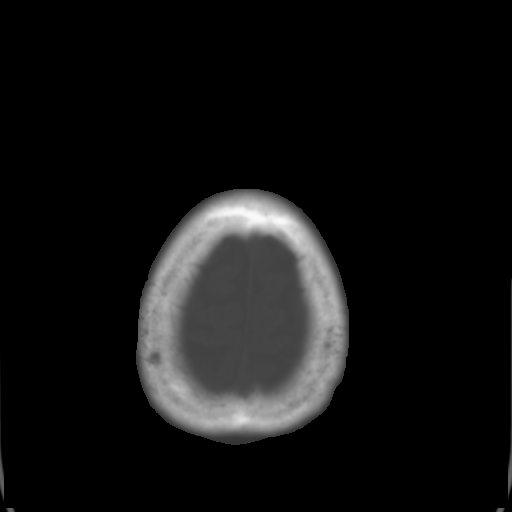

[16 of 47 positions shown; findings below may reference images not displayed]

FINDINGS: CT HEAD FINDINGS

Brain: No evidence of acute infarction, hemorrhage, hydrocephalus,
extra-axial collection or mass lesion/mass effect.

Vascular: No hyperdense vessel or unexpected calcification.

Skull: Normal. Negative for fracture or focal lesion.

Other: Minimally displaced left nasal bone fracture is noted.

CT MAXILLOFACIAL FINDINGS

Osseous: Bilateral nasal bone fractures are noted with mild
displacement on the left.

Orbits: Within normal limits.

Sinuses: Within normal limits.

Soft tissues: Mild soft tissue swelling is noted over the left eye
consistent with the recent injury. Some air is noted within the
subcutaneous tissues adjacent to the left nasal bone fracture.
IMPRESSION: CT of the head:  No acute intracranial abnormality noted.

CT of the maxillofacial bones: Bilateral nasal bone fractures and
mild soft tissue changes consistent with the recent injury. No
sizable hematoma is seen.

## 2022-03-13 ENCOUNTER — Emergency Department: Payer: Medicare Other

## 2022-03-13 ENCOUNTER — Encounter: Payer: Self-pay | Admitting: Emergency Medicine

## 2022-03-13 ENCOUNTER — Emergency Department
Admission: EM | Admit: 2022-03-13 | Discharge: 2022-03-13 | Disposition: A | Discharge status: Home / Self Care | Payer: Medicare Other | Attending: Emergency Medicine | Admitting: Emergency Medicine

## 2022-03-13 ENCOUNTER — Other Ambulatory Visit: Payer: Self-pay

## 2022-03-13 DIAGNOSIS — I10 Essential (primary) hypertension: Secondary | ICD-10-CM | POA: Diagnosis not present

## 2022-03-13 DIAGNOSIS — R42 Dizziness and giddiness: Secondary | ICD-10-CM | POA: Diagnosis present

## 2022-03-13 DIAGNOSIS — R778 Other specified abnormalities of plasma proteins: Secondary | ICD-10-CM | POA: Diagnosis not present

## 2022-03-13 LAB — COMPREHENSIVE METABOLIC PANEL
ALT: 16 U/L (ref 0–44)
AST: 18 U/L (ref 15–41)
Albumin: 4 g/dL (ref 3.5–5.0)
Alkaline Phosphatase: 71 U/L (ref 38–126)
Anion gap: 10 (ref 5–15)
BUN: 21 mg/dL (ref 8–23)
CO2: 22 mmol/L (ref 22–32)
Calcium: 8.5 mg/dL — ABNORMAL LOW (ref 8.9–10.3)
Chloride: 105 mmol/L (ref 98–111)
Creatinine, Ser: 1 mg/dL (ref 0.44–1.00)
GFR, Estimated: 54 mL/min — ABNORMAL LOW (ref >60)
Glucose, Bld: 116 mg/dL — ABNORMAL HIGH (ref 70–99)
Potassium: 3.4 mmol/L — ABNORMAL LOW (ref 3.5–5.1)
Sodium: 137 mmol/L (ref 135–145)
Total Bilirubin: 0.6 mg/dL (ref 0.3–1.2)
Total Protein: 7.5 g/dL (ref 6.5–8.1)

## 2022-03-13 LAB — CBC WITH DIFFERENTIAL/PLATELET
Abs Immature Granulocytes: 0.03 10*3/uL (ref 0.00–0.07)
Basophils Absolute: 0.1 10*3/uL (ref 0.0–0.1)
Basophils Relative: 1 %
Eosinophils Absolute: 0.1 10*3/uL (ref 0.0–0.5)
Eosinophils Relative: 2 %
HCT: 37.6 % (ref 36.0–46.0)
Hemoglobin: 12.4 g/dL (ref 12.0–15.0)
Immature Granulocytes: 0 %
Lymphocytes Relative: 35 %
Lymphs Abs: 2.6 10*3/uL (ref 0.7–4.0)
MCH: 29.2 pg (ref 26.0–34.0)
MCHC: 33 g/dL (ref 30.0–36.0)
MCV: 88.7 fL (ref 80.0–100.0)
Monocytes Absolute: 0.6 10*3/uL (ref 0.1–1.0)
Monocytes Relative: 8 %
Neutro Abs: 3.9 10*3/uL (ref 1.7–7.7)
Neutrophils Relative %: 54 %
Platelets: 278 10*3/uL (ref 150–400)
RBC: 4.24 MIL/uL (ref 3.87–5.11)
RDW: 13.7 % (ref 11.5–15.5)
WBC: 7.3 10*3/uL (ref 4.0–10.5)
nRBC: 0 % (ref 0.0–0.2)

## 2022-03-13 LAB — TROPONIN I (HIGH SENSITIVITY)
Troponin I (High Sensitivity): 27 ng/L — ABNORMAL HIGH (ref <18)
Troponin I (High Sensitivity): 22 ng/L — ABNORMAL HIGH (ref <18)

## 2022-03-13 NOTE — ED Triage Notes (Signed)
Patient ambulatory to triage with steady gait, without difficulty or distress noted; pt reports SBP 187-196 tonight accomp by dizziness; st recent BP med changes by PCP

## 2022-03-13 NOTE — ED Provider Notes (Signed)
[facility] Provider Note    Event Date/Time   First MD Initiated Contact with Patient 03/13/22 [phone]     (approximate)   History   Dizziness   HPI Susan Escobar is a 88 y.o. female  who presents to the emergency department today after an episode of high blood pressure. The patient states that she was lying in bed when she started feeling itchy all over. She checked her blood pressure and it was elevated. The patient has been working with her primary care doctor on managing her blood pressure. States that she recently had one of her medications changed. Blood pressure has been running in the 150s. Tonight when she checked it the blood pressure was in the 180s. At the time of my exam the patient states she is feeling better.        Physical Exam   Triage Vital Signs: ED Triage Vitals  Enc Vitals Group     BP 03/13/22 0029 (!) 186/72     Pulse Rate 03/13/22 0029 72     Resp 03/13/22 0029 16     Temp 03/13/22 0029 98 F (36.7 C)     Temp Source 03/13/22 0029 Oral     SpO2 03/13/22 0029 100 %     Weight 03/13/22 0009 156 lb (70.8 kg)     Height 03/13/22 0009 '5\' 1"'$  (1.549 m)     Head Circumference --      Peak Flow --      Pain Score 03/13/22 0009 0     Pain Loc --      Pain Edu? --      Excl. in [place]? --     Most recent vital signs: Vitals:   03/13/22 0302 03/13/22 0303  BP: (!) 159/52   Pulse:  73  Resp:    Temp:    SpO2:  98%   General: Awake, alert, oriented. CV:  Good peripheral perfusion. Regular rate and rhythm. Resp:  Normal effort. Lungs clear. Abd:  No distention.    ED Results / Procedures / Treatments   Labs (all labs ordered are listed, but only abnormal results are displayed) Labs Reviewed  COMPREHENSIVE METABOLIC PANEL - Abnormal; Notable for the following components:      Result Value   Potassium 3.4 (*)    Glucose, Bld 116 (*)    Calcium 8.5 (*)    GFR, Estimated 54 (*)    All other components within normal limits   TROPONIN I (HIGH SENSITIVITY) - Abnormal; Notable for the following components:   Troponin I (High Sensitivity) 27 (*)    All other components within normal limits  TROPONIN I (HIGH SENSITIVITY) - Abnormal; Notable for the following components:   Troponin I (High Sensitivity) 22 (*)    All other components within normal limits  CBC WITH DIFFERENTIAL/PLATELET  URINALYSIS, ROUTINE W REFLEX MICROSCOPIC     EKG  I, Nance Pear, attending physician, personally viewed and interpreted this EKG  EKG Time: 0022 Rate: 70 Rhythm: sinus rhythm Axis: left axis deviation Intervals: qtc 408 QRS: LVH ST changes: no st elevation Impression: abnormal ekg   RADIOLOGY I independently interpreted and visualized the CT head. My interpretation: No bleed. Radiology interpretation:  IMPRESSION:  Atrophy, chronic microvascular disease.    No acute intracranial abnormality.    I independently interpreted and visualized the CXR. My interpretation: No pneumonia. Radiology interpretation:  IMPRESSION:  1. Interval development of mild cardiomegaly.  2. Dilation of the  ascending thoracic aorta, new since prior  examination. This would be better assessed with dedicated CT imaging  if indicated.      PROCEDURES:  Critical Care performed: No  Procedures   MEDICATIONS ORDERED IN ED: Medications - No data to display   IMPRESSION / MDM / [place] / ED COURSE  I reviewed the triage vital signs and the nursing notes.                              Differential diagnosis includes, but is not limited to, high blood pressure, allergy.  Patient's presentation is most consistent with acute presentation with potential threat to life or bodily function.  Patient presented to the emergency department today because of concern for high blood pressure. At the time of my exam the patient is feeling better and blood pressure has improved. Blood work did show slight elevation in troponin  however it was not increasing. I have low suspicion for ACS. Additionally CXR showed possible dilation of the aorta. Did discuss this with the patient. Recommended she contact her PCP for further work up and evaluation. No chest pain.   FINAL CLINICAL IMPRESSION(S) / ED DIAGNOSES   Final diagnoses:  Hypertension, unspecified type     Note:  This document was prepared using Dragon voice recognition software and may include unintentional dictation errors.    Nance Pear, MD 03/13/22 [phone]

## 2022-03-13 NOTE — Discharge Instructions (Signed)
Please follow up with your doctor about obtaining further imaging of your aorta and further blood pressure management. Please seek medical attention for any high fevers, chest pain, shortness of breath, change in behavior, persistent vomiting, bloody stool or any other new or concerning symptoms.

## 2022-03-17 ENCOUNTER — Other Ambulatory Visit: Payer: Self-pay | Admitting: Internal Medicine

## 2022-03-17 DIAGNOSIS — I77810 Thoracic aortic ectasia: Secondary | ICD-10-CM

## 2022-03-17 DIAGNOSIS — I10 Essential (primary) hypertension: Secondary | ICD-10-CM

## 2022-03-31 ENCOUNTER — Ambulatory Visit
Admission: RE | Admit: 2022-03-31 | Discharge: 2022-03-31 | Disposition: A | Discharge status: Home / Self Care | Payer: Medicare Other | Source: Ambulatory Visit | Attending: Internal Medicine | Admitting: Internal Medicine

## 2022-03-31 DIAGNOSIS — I10 Essential (primary) hypertension: Secondary | ICD-10-CM | POA: Diagnosis present

## 2022-03-31 DIAGNOSIS — I77810 Thoracic aortic ectasia: Secondary | ICD-10-CM | POA: Insufficient documentation

## 2022-03-31 MED ORDER — IOHEXOL 350 MG/ML SOLN
75.0000 mL | Freq: Once | INTRAVENOUS | Status: AC | PRN
  Administered 2022-03-31: 75 mL via INTRAVENOUS

## 2023-01-30 ENCOUNTER — Emergency Department
Admission: EM | Admit: 2023-01-30 | Discharge: 2023-01-30 | Disposition: A | Discharge status: Home / Self Care | Payer: Medicare Other | Attending: Emergency Medicine | Admitting: Emergency Medicine

## 2023-01-30 ENCOUNTER — Other Ambulatory Visit: Payer: Self-pay

## 2023-01-30 DIAGNOSIS — I129 Hypertensive chronic kidney disease with stage 1 through stage 4 chronic kidney disease, or unspecified chronic kidney disease: Secondary | ICD-10-CM | POA: Insufficient documentation

## 2023-01-30 DIAGNOSIS — L509 Urticaria, unspecified: Secondary | ICD-10-CM | POA: Diagnosis not present

## 2023-01-30 DIAGNOSIS — E039 Hypothyroidism, unspecified: Secondary | ICD-10-CM | POA: Diagnosis not present

## 2023-01-30 DIAGNOSIS — R42 Dizziness and giddiness: Secondary | ICD-10-CM | POA: Diagnosis present

## 2023-01-30 DIAGNOSIS — N183 Chronic kidney disease, stage 3 unspecified: Secondary | ICD-10-CM | POA: Diagnosis not present

## 2023-01-30 DIAGNOSIS — R55 Syncope and collapse: Secondary | ICD-10-CM

## 2023-01-30 DIAGNOSIS — L299 Pruritus, unspecified: Secondary | ICD-10-CM

## 2023-01-30 LAB — COMPREHENSIVE METABOLIC PANEL
ALT: 15 U/L (ref 0–44)
AST: 16 U/L (ref 15–41)
Albumin: 3.8 g/dL (ref 3.5–5.0)
Alkaline Phosphatase: 42 U/L (ref 38–126)
Anion gap: 9 (ref 5–15)
BUN: 34 mg/dL — ABNORMAL HIGH (ref 8–23)
CO2: 20 mmol/L — ABNORMAL LOW (ref 22–32)
Calcium: 9.5 mg/dL (ref 8.9–10.3)
Chloride: 106 mmol/L (ref 98–111)
Creatinine, Ser: 1.19 mg/dL — ABNORMAL HIGH (ref 0.44–1.00)
GFR, Estimated: 43 mL/min — ABNORMAL LOW (ref >60)
Glucose, Bld: 119 mg/dL — ABNORMAL HIGH (ref 70–99)
Potassium: 4.4 mmol/L (ref 3.5–5.1)
Sodium: 135 mmol/L (ref 135–145)
Total Bilirubin: 0.6 mg/dL (ref 0.3–1.2)
Total Protein: 7 g/dL (ref 6.5–8.1)

## 2023-01-30 LAB — CBC WITH DIFFERENTIAL/PLATELET
Abs Immature Granulocytes: 0.04 10*3/uL (ref 0.00–0.07)
Basophils Absolute: 0.1 10*3/uL (ref 0.0–0.1)
Basophils Relative: 1 %
Eosinophils Absolute: 0.3 10*3/uL (ref 0.0–0.5)
Eosinophils Relative: 3 %
HCT: 34.9 % — ABNORMAL LOW (ref 36.0–46.0)
Hemoglobin: 11.5 g/dL — ABNORMAL LOW (ref 12.0–15.0)
Immature Granulocytes: 1 %
Lymphocytes Relative: 42 %
Lymphs Abs: 3.5 10*3/uL (ref 0.7–4.0)
MCH: 30.1 pg (ref 26.0–34.0)
MCHC: 33 g/dL (ref 30.0–36.0)
MCV: 91.4 fL (ref 80.0–100.0)
Monocytes Absolute: 0.5 10*3/uL (ref 0.1–1.0)
Monocytes Relative: 6 %
Neutro Abs: 4 10*3/uL (ref 1.7–7.7)
Neutrophils Relative %: 47 %
Platelets: 239 10*3/uL (ref 150–400)
RBC: 3.82 MIL/uL — ABNORMAL LOW (ref 3.87–5.11)
RDW: 13 % (ref 11.5–15.5)
WBC: 8.3 10*3/uL (ref 4.0–10.5)
nRBC: 0 % (ref 0.0–0.2)

## 2023-01-30 MED ORDER — DEXAMETHASONE SODIUM PHOSPHATE 10 MG/ML IJ SOLN
6.0000 mg | Freq: Once | INTRAMUSCULAR | Status: AC
  Administered 2023-01-30: 6 mg via INTRAVENOUS
  Filled 2023-01-30: qty 1

## 2023-01-30 MED ORDER — FAMOTIDINE IN NACL 20-0.9 MG/50ML-% IV SOLN
20.0000 mg | Freq: Once | INTRAVENOUS | Status: AC
  Administered 2023-01-30: 20 mg via INTRAVENOUS
  Filled 2023-01-30: qty 50

## 2023-01-30 MED ORDER — LACTATED RINGERS IV BOLUS
1000.0000 mL | Freq: Once | INTRAVENOUS | Status: AC
  Administered 2023-01-30: 1000 mL via INTRAVENOUS

## 2023-01-30 MED ORDER — DIPHENHYDRAMINE HCL 25 MG PO CAPS
25.0000 mg | ORAL_CAPSULE | Freq: Once | ORAL | Status: AC
  Administered 2023-01-30: 25 mg via ORAL
  Filled 2023-01-30: qty 1

## 2023-01-30 MED ORDER — DIPHENHYDRAMINE HCL 50 MG/ML IJ SOLN
12.5000 mg | Freq: Once | INTRAMUSCULAR | Status: AC
  Administered 2023-01-30: 12.5 mg via INTRAVENOUS
  Filled 2023-01-30: qty 1

## 2023-01-30 NOTE — ED Notes (Signed)
Pt ambulated around nurses station with independent steady gait.

## 2023-01-30 NOTE — ED Notes (Signed)
Dr. Smith at bedside.

## 2023-01-30 NOTE — ED Notes (Signed)
Pt reports she is feeling much better, denies any more itching. Skin WDL.

## 2023-01-30 NOTE — ED Provider Notes (Signed)
[facility] Provider Note    Event Date/Time   First MD Initiated Contact with Patient 01/30/23 0024     (approximate)   History   Loss of Consciousness, Pruritis, and Allergic Reaction   HPI  Susan Escobar is a 89 y.o. female who presents to the ED for evaluation of Loss of Consciousness, Pruritis, and Allergic Reaction   Review of PCP visit from April.  HTN, hypothyroidism, arthritis, HLD.  PAD on ASA.  CKD 3.  Patient presents to the ED with her daughter for evaluation of a syncopal episode superimposed on acute hives.  Daughter is visiting for [place] for a few days and patient typically lives by herself.  Patient began developing pruritus with multiple raised areas on her bilateral arms this evening around 10:30 PM while seated watching television.  Symptoms progressed , so she decided she would just go ahead and go to bed.  She got up to run her arms underneath some cold water to try to help develop dizziness and had a syncopal episode witnessed by the daughter.  No seizure activity, patient regained consciousness after about 1 minute without apparent postictal period.  She presents to the ED with continued pruritus   Physical Exam   Triage Vital Signs: ED Triage Vitals  Encounter Vitals Group     BP 01/30/23 0011 (!) 156/62     Systolic BP Percentile --      Diastolic BP Percentile --      Pulse Rate 01/30/23 0011 68     Resp 01/30/23 0011 17     Temp 01/30/23 0011 98.1 F (36.7 C)     Temp Source 01/30/23 0011 Oral     SpO2 01/30/23 0011 95 %     Weight 01/30/23 0010 155 lb (70.3 kg)     Height 01/30/23 0010 5\' 2"  (1.575 m)     Head Circumference --      Peak Flow --      Pain Score 01/30/23 0010 0     Pain Loc --      Pain Education --      Exclude from Growth Chart --     Most recent vital signs: Vitals:   01/30/23 0100 01/30/23 0130  BP: 135/62 (!) 148/54  Pulse: 69   Resp: 13 15  Temp:    SpO2: 97%      General: Awake, no distress.  No evidence of upper airway obstruction, wheezing CV:  Good peripheral perfusion.  Resp:  Normal effort.  No wheezing Abd:  No distention.  Soft and benign MSK:  No deformity noted.  Scattered hives to bilateral upper extremities and trunk. Neuro:  No focal deficits appreciated. Other:     ED Results / Procedures / Treatments   Labs (all labs ordered are listed, but only abnormal results are displayed) Labs Reviewed  CBC WITH DIFFERENTIAL/PLATELET - Abnormal; Notable for the following components:      Result Value   RBC 3.82 (*)    Hemoglobin 11.5 (*)    HCT 34.9 (*)    All other components within normal limits  COMPREHENSIVE METABOLIC PANEL - Abnormal; Notable for the following components:   CO2 20 (*)    Glucose, Bld 119 (*)    BUN 34 (*)    Creatinine, Ser 1.19 (*)    GFR, Estimated 43 (*)    All other components within normal limits  URINALYSIS, ROUTINE W REFLEX MICROSCOPIC    EKG Sinus rhythm with  a rate of 68 bpm.  Normal axis and intervals.  No clear signs of acute ischemia.  RADIOLOGY   Official radiology report(s): No results found.  PROCEDURES and INTERVENTIONS:  .1-3 Lead EKG Interpretation  Performed by: Delton Prairie, MD Authorized by: Delton Prairie, MD     Interpretation: normal     ECG rate:  70   ECG rate assessment: normal     Rhythm: sinus rhythm     Ectopy: none     Conduction: normal     Medications  diphenhydrAMINE (BENADRYL) capsule 25 mg (has no administration in time range)  diphenhydrAMINE (BENADRYL) capsule 25 mg (25 mg Oral Given 01/30/23 0038)  famotidine (PEPCID) IVPB 20 mg premix (0 mg Intravenous Stopped 01/30/23 0136)  dexamethasone (DECADRON) injection 6 mg (6 mg Intravenous Given 01/30/23 0040)  lactated ringers bolus 1,000 mL (0 mLs Intravenous Stopped 01/30/23 0208)  diphenhydrAMINE (BENADRYL) injection 12.5 mg (12.5 mg Intravenous Given 01/30/23 0039)     IMPRESSION / MDM / ASSESSMENT AND  PLAN / ED COURSE  I reviewed the triage vital signs and the nursing notes.  Differential diagnosis includes, but is not limited to, stroke, seizure, vasovagal episode, anaphylaxis, hives, atopic reaction  {Patient presents with symptoms of an acute illness or injury that is potentially life-threatening.  Patient presents for evaluation of a syncopal episode and pruritic rash, with evidence of hives and a vasovagal syncope, suitable for trial of outpatient management.  She presents neurologically intact without signs of trauma.  Hives without evidence of anaphylaxis.  Resolution of symptoms with antihistamines and a dose of Decadron.  Screening labs are benign with CKD near baseline, reassuring CBC.  No dysrhythmias on the monitor or ischemic features on EKG.  While I considered observation admission regarding her syncope, my suspicion is an increased vasovagal episode in the setting of histamine release and hives of uncertain etiology.  Discussed close return precautions, antihistamines at home and she is suitable for outpatient management  Clinical Course as of 01/30/23 0216  Sat Jan 30, 2023  0135 Reassessed.  Symptoms resolved and she is feeling much better and is appreciative. [DS]  0212 Ambulating without difficulty, dyspnea or dizziness.  Feeling well and ready to go.  Again discussed antihistamines and expectant management as well as return precautions [DS]    Clinical Course User Index [DS] Delton Prairie, MD     FINAL CLINICAL IMPRESSION(S) / ED DIAGNOSES   Final diagnoses:  Syncope and collapse  Hives  Pruritus     Rx / DC Orders   ED Discharge Orders     None        Note:  This document was prepared using Dragon voice recognition software and may include unintentional dictation errors.   Delton Prairie, MD 01/30/23 [phone]

## 2023-01-30 NOTE — ED Notes (Signed)
..  The patient is A&OX4, ambulatory at d/c with independent steady gait but wheeled out of ED via wheelchair, NAD. Pt verbalized understanding of d/c instructions and follow up care.

## 2023-01-30 NOTE — Discharge Instructions (Addendum)
As we discussed, I suspect you had an episode of hives that contributed to you passing out.  As the Benadryl we gave you wears off, you may have a recurrence of some of the itching sensation in the next day or so, if this is the case then please use over-the-counter Benadryl.  25-50 mg (1-2 tablets) per dose  If your symptoms worsen despite this, or if you have further episodes of passing out then please return to the ED

## 2023-01-30 NOTE — ED Triage Notes (Signed)
PER EMS: pt arrives from home with c/o redness and itching to her arms that started tonight at 2230. Pt unsure what caused it. Denies trouble breathing/[place]ob. She put her arms under cold water and then passed out for a few seconds followed by an episode of slurred speech, witnessed by her daughter. Her daughter caught her as assisted her to the ground. Pt is speaking clear complete sentences. A&Ox4. She complains of itching to her arms, back, hands and breasts.   BP- 120/41, HR61, 97% RA, CBG-112

## 2023-12-17 ENCOUNTER — Ambulatory Visit
Admission: RE | Admit: 2023-12-17 | Discharge: 2023-12-17 | Disposition: A | Discharge status: Home / Self Care | Source: Ambulatory Visit | Attending: Internal Medicine | Admitting: Internal Medicine

## 2023-12-17 ENCOUNTER — Other Ambulatory Visit: Payer: Self-pay

## 2023-12-17 DIAGNOSIS — M7989 Other specified soft tissue disorders: Secondary | ICD-10-CM | POA: Insufficient documentation
